# Patient Record
Sex: Female | Born: 2005 | Race: Black or African American | Hispanic: No | Marital: Single | Smoking: Never smoker
Health system: Southern US, Community
[De-identification: ages and names within clinical notes are randomized; demographics above are authoritative.]

---
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
## 2024-07-17 ENCOUNTER — Other Ambulatory Visit: Payer: Self-pay

## 2024-07-17 ENCOUNTER — Inpatient Hospital Stay (HOSPITAL_BASED_OUTPATIENT_CLINIC_OR_DEPARTMENT_OTHER)
Admission: AD | Admit: 2024-07-17 | Discharge: 2024-07-19 | DRG: 832 | Disposition: A | Discharge status: Home / Self Care | Attending: Obstetrics & Gynecology | Admitting: Obstetrics & Gynecology

## 2024-07-17 ENCOUNTER — Encounter (HOSPITAL_BASED_OUTPATIENT_CLINIC_OR_DEPARTMENT_OTHER): Payer: Self-pay

## 2024-07-17 DIAGNOSIS — Z833 Family history of diabetes mellitus: Secondary | ICD-10-CM

## 2024-07-17 DIAGNOSIS — D62 Acute posthemorrhagic anemia: Secondary | ICD-10-CM | POA: Diagnosis present

## 2024-07-17 DIAGNOSIS — Z8249 Family history of ischemic heart disease and other diseases of the circulatory system: Secondary | ICD-10-CM

## 2024-07-17 DIAGNOSIS — D649 Anemia, unspecified: Secondary | ICD-10-CM | POA: Diagnosis present

## 2024-07-17 DIAGNOSIS — O364XX0 Maternal care for intrauterine death, not applicable or unspecified: Principal | ICD-10-CM | POA: Diagnosis present

## 2024-07-17 DIAGNOSIS — Z3A20 20 weeks gestation of pregnancy: Secondary | ICD-10-CM

## 2024-07-17 LAB — COMPREHENSIVE METABOLIC PANEL WITH GFR
ALT: <5 U/L (ref 0–44)
AST: 27 U/L (ref 15–41)
Albumin: 4 g/dL (ref 3.5–5.0)
Alkaline Phosphatase: 158 U/L — ABNORMAL HIGH (ref 38–126)
Anion gap: 16 — ABNORMAL HIGH (ref 5–15)
BUN: <5 mg/dL — ABNORMAL LOW (ref 6–20)
CO2: 18 mmol/L — ABNORMAL LOW (ref 22–32)
Calcium: 9.5 mg/dL (ref 8.9–10.3)
Chloride: 104 mmol/L (ref 98–111)
Creatinine, Ser: 0.69 mg/dL (ref 0.44–1.00)
GFR, Estimated: >60 mL/min (ref >60)
Glucose, Bld: 89 mg/dL (ref 70–99)
Potassium: 3.6 mmol/L (ref 3.5–5.1)
Sodium: 137 mmol/L (ref 135–145)
Total Bilirubin: 0.4 mg/dL (ref 0.0–1.2)
Total Protein: 7.8 g/dL (ref 6.5–8.1)

## 2024-07-17 LAB — CBC
HCT: 32.3 % — ABNORMAL LOW (ref 36.0–46.0)
Hemoglobin: 10.8 g/dL — ABNORMAL LOW (ref 12.0–15.0)
MCH: 30.3 pg (ref 26.0–34.0)
MCHC: 33.4 g/dL (ref 30.0–36.0)
MCV: 90.5 fL (ref 80.0–100.0)
Platelets: 316 K/uL (ref 150–400)
RBC: 3.57 MIL/uL — ABNORMAL LOW (ref 3.87–5.11)
RDW: 12 % (ref 11.5–15.5)
WBC: 7.8 K/uL (ref 4.0–10.5)
nRBC: 0 % (ref 0.0–0.2)

## 2024-07-17 LAB — LIPASE, BLOOD: Lipase: 10 U/L — ABNORMAL LOW (ref 11–51)

## 2024-07-17 MED ORDER — HYDROMORPHONE HCL 1 MG/ML IJ SOLN
0.5000 mg | INTRAMUSCULAR | Status: DC | PRN
  Administered 2024-07-17 – 2024-07-18 (×2): 0.5 mg via INTRAVENOUS
  Filled 2024-07-17 (×3): qty 1

## 2024-07-17 MED ORDER — SODIUM CHLORIDE 0.9 % IV BOLUS
1000.0000 mL | Freq: Once | INTRAVENOUS | Status: AC
  Administered 2024-07-17: 1000 mL via INTRAVENOUS

## 2024-07-17 NOTE — ED Triage Notes (Signed)
 Abdominal pain starting at 1400. Went to vomit and felt something come out of her vagina. States she can still feel it 'hanging there'. Nothing visualized externally on exam. Patient unable to sit.

## 2024-07-17 NOTE — ED Notes (Signed)
 Patient called out of the triage room and said that something 'popped'. Brown watery liquid on the floor. Dr. Trine to triage to assess. Patient felt relief of pain. Change of clothes provided.

## 2024-07-17 NOTE — ED Provider Notes (Incomplete)
 [facility] Provider Note  CSN: [id number] Arrival date & time: 07/17/24 2312  Chief Complaint(s) No chief complaint on file.  HPI Anna Gross is a 18 y.o. female currently on her menstrual cycle is here for abdominal cramping and bulging coming to her vagina.  Patient feels like something is about to pop out.  Mother reported seeing a sac that look like gestational sac.  Patient reports having a negative pregnancy test last month.  States that she was last sexually active in April.  Her last menstrual cycle was August.  The history is provided by the patient.    Past Medical History History reviewed. No pertinent past medical history. There are no active problems to display for this patient.  Home Medication(s) Prior to Admission medications   Medication Sig Start Date End Date Taking? Authorizing Provider  cholecalciferol (VITAMIN D) 400 units TABS tablet Take 400 Units by mouth.    [provider]  doxycycline  (VIBRAMYCIN ) 100 MG capsule Take 1 capsule (100 mg total) by mouth 2 (two) times daily. 04/13/23   Christopher Savannah, PA-C  ondansetron  (ZOFRAN ) 8 MG tablet Take 1 tablet (8 mg total) by mouth every 8 (eight) hours as needed for nausea or vomiting. 11/09/20   Graham, Laura E, PA-C                                                                                                                                    Allergies Patient has no known allergies.  Review of Systems Review of Systems As noted in HPI  Physical Exam Vital Signs  I have reviewed the triage vital signs BP (!) 144/93 (BP Location: Right Arm)   Pulse (!) 114   Temp 98.2 F (36.8 C) (Oral)   Resp 16   LMP 07/17/2024 (Exact Date)   SpO2 98%  *** Physical Exam Vitals reviewed.  Constitutional:      General: She is not in acute distress.    Appearance: She is well-developed. She is not diaphoretic.  HENT:     Head: Normocephalic and atraumatic.      Right Ear: External ear normal.     Left Ear: External ear normal.     Nose: Nose normal.  Eyes:     General: No scleral icterus.    Conjunctiva/sclera: Conjunctivae normal.  Neck:     Trachea: Phonation normal.  Cardiovascular:     Rate and Rhythm: Normal rate and regular rhythm.  Pulmonary:     Effort: Pulmonary effort is normal. No respiratory distress.     Breath sounds: No stridor.  Abdominal:     General: There is no distension.  Genitourinary:    Comments: *** Musculoskeletal:        General: Normal range of motion.     Cervical back: Normal range of motion.  Neurological:     Mental Status: She is alert and oriented to  person, place, and time.  Psychiatric:        Behavior: Behavior normal.     ED Results and Treatments Labs (all labs ordered are listed, but only abnormal results are displayed) Labs Reviewed  LIPASE, BLOOD - Abnormal; Notable for the following components:      Result Value   Lipase 10 (*)    All other components within normal limits  COMPREHENSIVE METABOLIC PANEL WITH GFR - Abnormal; Notable for the following components:   CO2 18 (*)    BUN <5 (*)    Alkaline Phosphatase 158 (*)    Anion gap 16 (*)    All other components within normal limits  CBC - Abnormal; Notable for the following components:   RBC 3.57 (*)    Hemoglobin 10.8 (*)    HCT 32.3 (*)    All other components within normal limits  URINALYSIS, ROUTINE W REFLEX MICROSCOPIC  PREGNANCY, URINE  HCG, QUANTITATIVE, PREGNANCY                                                                                                                         EKG  EKG Interpretation Date/Time:    Ventricular Rate:    PR Interval:    QRS Duration:    QT Interval:    QTC Calculation:   R Axis:      Text Interpretation:         Radiology No results found.  Medications Ordered in ED Medications  sodium chloride  0.9 % bolus 1,000 mL (has no administration in time range)  HYDROmorphone   (DILAUDID ) injection 0.5 mg (has no administration in time range)   Procedures Procedures  (including critical care time) Medical Decision Making / ED Course   Medical Decision Making Amount and/or Complexity of Data Reviewed Labs: ordered. Radiology: ordered.  Risk Prescription drug management.    ***    Final Clinical Impression(s) / ED Diagnoses Final diagnoses:  None    This chart was dictated using voice recognition software.  Despite best efforts to proofread,  errors can occur which can change the documentation meaning.

## 2024-07-18 ENCOUNTER — Other Ambulatory Visit: Payer: Self-pay | Admitting: Family Medicine

## 2024-07-18 ENCOUNTER — Encounter (HOSPITAL_COMMUNITY): Payer: Self-pay | Admitting: Obstetrics and Gynecology

## 2024-07-18 ENCOUNTER — Inpatient Hospital Stay (HOSPITAL_COMMUNITY)

## 2024-07-18 ENCOUNTER — Encounter: Payer: Self-pay | Admitting: Family Medicine

## 2024-07-18 DIAGNOSIS — D649 Anemia, unspecified: Secondary | ICD-10-CM | POA: Diagnosis present

## 2024-07-18 DIAGNOSIS — O364XX0 Maternal care for intrauterine death, not applicable or unspecified: Principal | ICD-10-CM

## 2024-07-18 DIAGNOSIS — O99011 Anemia complicating pregnancy, first trimester: Secondary | ICD-10-CM

## 2024-07-18 DIAGNOSIS — O99019 Anemia complicating pregnancy, unspecified trimester: Secondary | ICD-10-CM | POA: Insufficient documentation

## 2024-07-18 DIAGNOSIS — D62 Acute posthemorrhagic anemia: Secondary | ICD-10-CM | POA: Diagnosis present

## 2024-07-18 DIAGNOSIS — Z833 Family history of diabetes mellitus: Secondary | ICD-10-CM | POA: Diagnosis not present

## 2024-07-18 DIAGNOSIS — Z3A20 20 weeks gestation of pregnancy: Secondary | ICD-10-CM | POA: Diagnosis not present

## 2024-07-18 DIAGNOSIS — Z8249 Family history of ischemic heart disease and other diseases of the circulatory system: Secondary | ICD-10-CM | POA: Diagnosis not present

## 2024-07-18 LAB — CBC
HCT: 28.2 % — ABNORMAL LOW (ref 36.0–46.0)
HCT: 24.2 % — ABNORMAL LOW (ref 36.0–46.0)
HCT: 22.5 % — ABNORMAL LOW (ref 36.0–46.0)
Hemoglobin: 9.2 g/dL — ABNORMAL LOW (ref 12.0–15.0)
Hemoglobin: 8.2 g/dL — ABNORMAL LOW (ref 12.0–15.0)
Hemoglobin: 7.3 g/dL — ABNORMAL LOW (ref 12.0–15.0)
MCH: 30.1 pg (ref 26.0–34.0)
MCH: 31.1 pg (ref 26.0–34.0)
MCH: 29.9 pg (ref 26.0–34.0)
MCHC: 32.6 g/dL (ref 30.0–36.0)
MCHC: 33.9 g/dL (ref 30.0–36.0)
MCHC: 32.4 g/dL (ref 30.0–36.0)
MCV: 92.2 fL (ref 80.0–100.0)
MCV: 91.7 fL (ref 80.0–100.0)
MCV: 92.2 fL (ref 80.0–100.0)
Platelets: 292 K/uL (ref 150–400)
Platelets: 268 K/uL (ref 150–400)
Platelets: 252 K/uL (ref 150–400)
RBC: 3.06 MIL/uL — ABNORMAL LOW (ref 3.87–5.11)
RBC: 2.64 MIL/uL — ABNORMAL LOW (ref 3.87–5.11)
RBC: 2.44 MIL/uL — ABNORMAL LOW (ref 3.87–5.11)
RDW: 12.1 % (ref 11.5–15.5)
RDW: 12.3 % (ref 11.5–15.5)
RDW: 12.4 % (ref 11.5–15.5)
WBC: 12.3 K/uL — ABNORMAL HIGH (ref 4.0–10.5)
WBC: 10.4 K/uL (ref 4.0–10.5)
WBC: 7.2 K/uL (ref 4.0–10.5)
nRBC: 0 % (ref 0.0–0.2)
nRBC: 0 % (ref 0.0–0.2)
nRBC: 0 % (ref 0.0–0.2)

## 2024-07-18 LAB — PREPARE RBC (CROSSMATCH)

## 2024-07-18 LAB — HCG, QUANTITATIVE, PREGNANCY: hCG, Beta Chain, Quant, S: 201 m[IU]/mL — ABNORMAL HIGH (ref <5)

## 2024-07-18 MED ORDER — ACETAMINOPHEN 500 MG PO TABS
1000.0000 mg | ORAL_TABLET | Freq: Once | ORAL | Status: AC
  Administered 2024-07-18: 1000 mg via ORAL
  Filled 2024-07-18: qty 2

## 2024-07-18 MED ORDER — ONDANSETRON 4 MG PO TBDP
4.0000 mg | ORAL_TABLET | Freq: Three times a day (TID) | ORAL | Status: DC | PRN
  Administered 2024-07-18: 4 mg via ORAL
  Filled 2024-07-18: qty 1

## 2024-07-18 MED ORDER — DIPHENHYDRAMINE HCL 50 MG/ML IJ SOLN
25.0000 mg | Freq: Once | INTRAMUSCULAR | Status: AC
  Administered 2024-07-18: 25 mg via INTRAVENOUS
  Filled 2024-07-18: qty 1

## 2024-07-18 MED ORDER — ONDANSETRON HCL 4 MG/2ML IJ SOLN
4.0000 mg | Freq: Once | INTRAMUSCULAR | Status: AC
  Administered 2024-07-18: 4 mg via INTRAVENOUS
  Filled 2024-07-18: qty 2

## 2024-07-18 MED ORDER — SODIUM CHLORIDE 0.9% IV SOLUTION: Freq: Once | INTRAVENOUS | Status: AC

## 2024-07-18 MED ORDER — MISOPROSTOL 200 MCG PO TABS
800.0000 ug | ORAL_TABLET | Freq: Once | ORAL | Status: AC
  Administered 2024-07-18: 800 ug via RECTAL
  Filled 2024-07-18: qty 4

## 2024-07-18 MED ORDER — LACTATED RINGERS IV SOLN: INTRAVENOUS | Status: AC

## 2024-07-18 MED ORDER — ACETAMINOPHEN 325 MG PO TABS
650.0000 mg | ORAL_TABLET | ORAL | Status: DC | PRN
  Administered 2024-07-18 – 2024-07-19 (×3): 650 mg via ORAL
  Filled 2024-07-18 (×3): qty 2

## 2024-07-18 MED ORDER — IBUPROFEN 600 MG PO TABS
600.0000 mg | ORAL_TABLET | Freq: Four times a day (QID) | ORAL | Status: DC | PRN
  Administered 2024-07-19: 600 mg via ORAL
  Filled 2024-07-18: qty 1

## 2024-07-18 NOTE — ED Notes (Signed)
 Delivery of fetus with no signs of life at approx 12:15am

## 2024-07-18 NOTE — ED Notes (Signed)
 While sitting with patient and her mother providing emotional support, pt began c/o lower abd cramping once again and advised me that it's out. Upon inspecting, the object protruding from pt's vagina was coming out a little bit more. MD arrived in the room and as pt continued to feel the urge to push, he assisted with delivery of a deceased fetus. Umbilical cord and placenta were still attached and intact. Pt was encouraged to keep pushing as MD facilitated delivery of placenta. Large amount of bright red blood noted. MD performed fundal massage. Pt denied knowing she was pregnant. States she took a pregnancy test approx 1 month ago and it was negative. Pt states last intercourse was in April. Pt was cleaned up, gown and linen changes performed. Pt was medicated for pain and nausea per MAR. [facility] arrived in ED and care was transferred without further incident.

## 2024-07-18 NOTE — Plan of Care (Signed)

## 2024-07-18 NOTE — Progress Notes (Signed)
 Patient reports feeling better, first pRBC unit is transfusing.  Blood pressure 114/67, pulse (!) 102, temperature 99.4 F (37.4 C), temperature source Oral, resp. rate 18, height 5' 1 (1.549 m), weight 48.1 kg, last menstrual period 07/17/2024, SpO2 100%.  Will continue transfusion as ordered, check CBC in morning. Routine floor care.  Gloris Hugger, MD

## 2024-07-18 NOTE — Progress Notes (Addendum)
 Called by RN due to patient having symptoms of acute blood loss anemia.   Saw patient, she is dizzy with standing and palpitations She reports improved bleeding over the course of the day   Exam:  BP (!) 133/94   Pulse (!) 122   Temp 98.4 F (36.9 C) (Oral)   Resp 18   Ht 5' 1 (1.549 m)   Wt 48.1 kg   LMP 07/17/2024 (Exact Date)   SpO2 100%   BMI 20.03 kg/m   Gen; Pale appearing HEENT: conjunctiva pale CV: Tachycardia. Slow cap refill    Reviewed HGB results from prior showing downward trend  Lab Results  Component Value Date   HGB 8.2 (L) 07/18/2024   HGB 9.2 (L) 07/18/2024   HGB 10.8 (L) 07/17/2024   A/P: Recommended and ordered stat CBC, possibly patient is equilibrating <8.0 Consented for blood is < 8.0 If stable will discharge home and plan for outpatient FE -- orders were placed already for this  Suzen Maryan Masters, MD

## 2024-07-18 NOTE — H&P (Signed)
 FACULTY PRACTICE ANTEPARTUM ADMISSION HISTORY AND PHYSICAL NOTE   History of Present Illness: Anna Gross is a 18 y.o. G1P0000  admitted for IUFD at >20 weeks. She initially presented to the [facility] ED for abdominal cramping and bulging from her vagina. Unknown last LMP. Last sexually active in April of 2025. She delivered a fetus and placenta at [facility] ED and was then transferred for further evaluation. She denies knowing she was pregnant and so has had no prenatal care. She endorses VB, fatigue.   Patient Active Problem List   Diagnosis Date Noted   IUFD at 20 weeks or more of gestation 07/18/2024    History reviewed. No pertinent past medical history.  History reviewed. No pertinent surgical history.  OB History  Gravida Para Term Preterm AB Living  1 0  0    SAB IAB Ectopic Multiple Live Births          # Outcome Date GA Lbr Len/2nd Weight Sex Type Anes PTL Lv  1 Current             Social History   Socioeconomic History   Marital status: Single    Spouse name: Not on file   Number of children: Not on file   Years of education: Not on file   Highest education level: Not on file  Occupational History   Not on file  Tobacco Use   Smoking status: Never   Smokeless tobacco: Never  Vaping Use   Vaping status: Never Used  Substance and Sexual Activity   Alcohol use: Yes    Comment: Occ   Drug use: Never   Sexual activity: Not Currently    Birth control/protection: None  Other Topics Concern   Not on file  Social History Narrative   Not on file   Social Drivers of Health   Financial Resource Strain: Not on File (02/17/2022)   Received from [employer]    Financial Resource Strain: 0  Food Insecurity: Not at Risk (09/12/2023)   Received from [employer] Insecurity    Within the past 12 months, the food you bought just didn't last and you didn't have enough money to get more.: 1  Transportation Needs: Not at Risk (09/12/2023)   Received  from [employer] Needs    In the past 12 months, has lack of transportation kept you from medical appointments, meetings, work or from getting things needed for daily living? (Check all that apply): 1  Physical Activity: Not on File (02/17/2022)   Received from [facility]   Physical Activity    Physical Activity: 0  Stress: Not on File (02/17/2022)   Received from [facility]   Stress    Stress: 0  Social Connections: Not on File (07/15/2023)   Received from [facility]   Social Connections    Connectedness: 0    Family History  Problem Relation Age of Onset   Hypertension Mother    Diabetes Mother    Healthy Father     No Known Allergies  Medications Prior to Admission  Medication Sig Dispense Refill Last Dose/Taking   cholecalciferol (VITAMIN D) 400 units TABS tablet Take 400 Units by mouth.      doxycycline  (VIBRAMYCIN ) 100 MG capsule Take 1 capsule (100 mg total) by mouth 2 (two) times daily. 14 capsule 0    ondansetron  (ZOFRAN ) 8 MG tablet Take 1 tablet (8 mg total) by mouth every 8 (eight) hours as needed for nausea or  vomiting. 20 tablet 0     Review of Systems - Negative except for what is listed in the HPI.  Vitals:  BP 124/73   Pulse (!) 120   Temp 99.1 F (37.3 C) (Oral)   Resp (!) 25   Ht 5' 1 (1.549 m)   Wt 48.1 kg   LMP 07/17/2024 (Exact Date)   SpO2 100%   BMI 20.03 kg/m  Physical Examination: CONSTITUTIONAL: Well-developed, well-nourished female in no acute distress.  HENT:  Normocephalic, atraumatic, External right and left ear normal. Oropharynx is clear and moist EYES: Conjunctivae and EOM are normal. Pupils are equal, round, and reactive to light. No scleral icterus.  NECK: Normal range of motion, supple, no masses SKIN: Skin is warm and dry. No rash noted. Not diaphoretic. No erythema. No pallor. NEUROLOGIC: Alert and oriented to person, place, and time. Normal reflexes, muscle tone coordination. No cranial nerve deficit noted. PSYCHIATRIC: Normal  mood and affect. Normal behavior. Normal judgment and thought content. CARDIOVASCULAR: Normal heart rate noted, regular rhythm RESPIRATORY: Effort and breath sounds normal, no problems with respiration noted ABDOMEN: Soft, nontender, nondistended, gravid. MUSCULOSKELETAL: Normal range of motion. No edema and no tenderness. 2+ distal pulses.   Labs:  Results for orders placed or performed during the hospital encounter of 07/17/24 (from the past 24 hours)  Lipase, blood   Collection Time: 07/17/24 11:22 PM  Result Value Ref Range   Lipase 10 (L) 11 - 51 U/L  Comprehensive metabolic panel   Collection Time: 07/17/24 11:22 PM  Result Value Ref Range   Sodium 137 135 - 145 mmol/L   Potassium 3.6 3.5 - 5.1 mmol/L   Chloride 104 98 - 111 mmol/L   CO2 18 (L) 22 - 32 mmol/L   Glucose, Bld 89 70 - 99 mg/dL   BUN <5 (L) 6 - 20 mg/dL   Creatinine, Ser 9.30 0.44 - 1.00 mg/dL   Calcium 9.5 8.9 - 89.6 mg/dL   Total Protein 7.8 6.5 - 8.1 g/dL   Albumin 4.0 3.5 - 5.0 g/dL   AST 27 15 - 41 U/L   ALT <5 0 - 44 U/L   Alkaline Phosphatase 158 (H) 38 - 126 U/L   Total Bilirubin 0.4 0.0 - 1.2 mg/dL   GFR, Estimated >39 >39 mL/min   Anion gap 16 (H) 5 - 15  CBC   Collection Time: 07/17/24 11:22 PM  Result Value Ref Range   WBC 7.8 4.0 - 10.5 K/uL   RBC 3.57 (L) 3.87 - 5.11 MIL/uL   Hemoglobin 10.8 (L) 12.0 - 15.0 g/dL   HCT 67.6 (L) 63.9 - 53.9 %   MCV 90.5 80.0 - 100.0 fL   MCH 30.3 26.0 - 34.0 pg   MCHC 33.4 30.0 - 36.0 g/dL   RDW 87.9 88.4 - 84.4 %   Platelets 316 150 - 400 K/uL   nRBC 0.0 0.0 - 0.2 %  hCG, quantitative, pregnancy   Collection Time: 07/17/24 11:22 PM  Result Value Ref Range   hCG, Beta Chain, Quant, S 201 (H) <5 mIU/mL  Type and screen [facility]   Collection Time: 07/18/24  1:26 AM  Result Value Ref Range   ABO/RH(D) B POS    Antibody Screen NEG    Sample Expiration      07/21/2024,2359 Performed at [facility], [address]  [place], [place] [address]   CBC   Collection Time: 07/18/24  1:30 AM  Result Value Ref  Range   WBC 12.3 (H) 4.0 - 10.5 K/uL   RBC 3.06 (L) 3.87 - 5.11 MIL/uL   Hemoglobin 9.2 (L) 12.0 - 15.0 g/dL   HCT 71.7 (L) 63.9 - 53.9 %   MCV 92.2 80.0 - 100.0 fL   MCH 30.1 26.0 - 34.0 pg   MCHC 32.6 30.0 - 36.0 g/dL   RDW 87.8 88.4 - 84.4 %   Platelets 292 150 - 400 K/uL   nRBC 0.0 0.0 - 0.2 %    Imaging Studies: US  PELVIS (TRANSABDOMINAL ONLY) Result Date: 07/18/2024 CLINICAL DATA:  [id number]. Miscarriage 8-20 weeks' gestation. Fetal demise prior to delivery. Now with heavy vaginal bleeding. EXAM: TRANSABDOMINAL ULTRASOUND OF PELVIS TECHNIQUE: Transabdominal ultrasound examination of the pelvis was performed including evaluation of the uterus, ovaries, adnexal regions, and pelvic cul-de-sac. COMPARISON:  None. FINDINGS: Uterus Measurements: Anteverted measuring 13.2 x 7.4 x 8.6 cm = volume: 442.1 mL. No fibroids or other mass visualized. The cervix is not well seen due to bowel gas shadowing with the visualized portions unremarkable. Endometrium Thickness: 8.4 mm. There is scattered vascular flow within the complex in the proximal uterine cavity concerning for retained products of conception. Right ovary Measurements: 2.4 x 1.9 x 2.5 cm = volume: 5.9 mL. Normal appearance/no adnexal mass. Left ovary Measurements: 2.5 x 1.1 x 2.7 cm = volume: 3.6 mL. Normal appearance/no adnexal mass. Other findings: There is preservation of color flow to both ovaries. No abnormal free fluid. IMPRESSION: 1. Scattered vascular flow within the endometrial complex in the proximal uterine cavity concerning for retained products of conception. 2. The cervix is not well seen due to bowel gas shadowing with the visualized portions unremarkable. 3. The adnexal spaces and ovaries are unremarkable. 4. No abnormal free fluid. Electronically Signed   By: Francis Quam M.D.   On: 07/18/2024 03:05     Assessment and Plan: Patient Active  Problem List   Diagnosis Date Noted   IUFD at 20 weeks or more of gestation 07/18/2024   Ferrah Panagopoulos is a 18 y.o. G1P0000  admitted for IUFD at >20 weeks.  -Transported from [facility] ED after IUFD and delivery. Fetus and placenta delivered with patient. Based on size, appears to be >20 weeks. -Fundus was firm on arrival but massage expressed large amount of clots. Continued fundal massage improved vaginal bleeding. QBL as of now 687. -US  concerning for retained products of conception. -Patient is Rh (+) -CBC 10.8 -> 9.2  - cyotec given rectally  Admit to [facility] specialty care for observation -Will continue to observe bleeding and repeat CBC in 4 hours    Barkley Angles, MD OB Fellow, Faculty Practice [facility], Center for [employer]

## 2024-07-18 NOTE — Progress Notes (Signed)
 Patient seen and evaluated this morning. Reports bleeding as mild Denies pain. Reports lightheadedness/dizziness with ambulation  BP 106/64 (BP Location: Left Arm)   Pulse (!) 111   Temp 98 F (36.7 C) (Oral)   Resp 18   Ht 5' 1 (1.549 m)   Wt 48.1 kg   LMP 07/17/2024 (Exact Date)   SpO2 100%   BMI 20.03 kg/m   Gen: appears well Abd: soft, nontender Lochia: small trickle with fundal exam  Lab Results  Component Value Date   WBC 12.3 (H) 07/18/2024   HGB 9.2 (L) 07/18/2024   HCT 28.2 (L) 07/18/2024   MCV 92.2 07/18/2024   PLT 292 07/18/2024    A/P: - repeat CBC this am - monitor bleeding - discussed possibility of procedure prn symptoms  Rollo ONEIDA Bring, MD, FACOG Obstetrician & Gynecologist, [facility] for [facility], [facility]

## 2024-07-18 NOTE — ED Notes (Signed)
 Patient called out from the restroom stating something is coming out'. Elnor object coming from pelvis. Dr. Trine called to assess. Patient transported to room via wheelchair. Placed in trendelenburg.

## 2024-07-18 NOTE — MAU Provider Note (Signed)
 Patient came in after IUFD delivered at [facility] ED. Sent to MAU for further monitoring. Is being admitted to [facility] specialty care for further observation. See H&P for more details.

## 2024-07-18 NOTE — Progress Notes (Signed)
 Patient with continued symptoms of lightheadedness and weakness; also dizzy and feels heart is racing.  Blood pressure (!) 133/94, pulse (!) 122, temperature 98.4 F (36.9 C), temperature source Oral, resp. rate 18, height 5' 1 (1.549 m), weight 48.1 kg, last menstrual period 07/17/2024, SpO2 100%. No active bleeding reported, patient noted small amount of blood on pad     Latest Ref Rng & Units 07/18/2024    4:43 PM 07/18/2024    7:16 AM 07/18/2024    1:30 AM  CBC  WBC 4.0 - 10.5 K/uL 7.2  10.4  12.3   Hemoglobin 12.0 - 15.0 g/dL 7.3  8.2  9.2   Hematocrit 36.0 - 46.0 % 22.5  24.2  28.2   Platelets 150 - 400 K/uL 252  268  292    Patient has symptomatic acute blood loss anemia after miscarriage. Blood transfusion recommended, patient agreed to plan. She was consented for blood transfusion. 2 pRBCs ordered for now, will check post transfusion hemoglobin in morning. Will keep patient overnight for observation and for the transfusion.   GLORIS HUGGER, MD, FACOG Obstetrician & Gynecologist, [facility] for [employer], [facility]

## 2024-07-18 NOTE — MAU Note (Signed)
 Anna Gross is a 18 y.o. at Unknown here in MAU reporting: IUFD transfer from [facility]   LMP: 06/14/2024 Onset of complaint: 07/17/24 @1600  Pain score: 5 Vitals:   07/18/24 0045 07/18/24 0127  BP: 121/87   Pulse: 99   Resp: (!) 25   Temp:  99.1 F (37.3 C)  SpO2: 100%      FHT: N/A  Lab orders placed from triage: N/A

## 2024-07-19 ENCOUNTER — Encounter: Payer: Self-pay | Admitting: Family Medicine

## 2024-07-19 ENCOUNTER — Other Ambulatory Visit (HOSPITAL_COMMUNITY): Payer: Self-pay

## 2024-07-19 ENCOUNTER — Encounter: Payer: Self-pay | Admitting: Obstetrics & Gynecology

## 2024-07-19 ENCOUNTER — Telehealth: Payer: Self-pay

## 2024-07-19 ENCOUNTER — Other Ambulatory Visit: Payer: Self-pay

## 2024-07-19 LAB — BPAM RBC
Blood Product Expiration Date: 202510092359
Blood Product Expiration Date: 202510102359
ISSUE DATE / TIME: 202509182015
ISSUE DATE / TIME: 202509182358
Unit Type and Rh: 7300
Unit Type and Rh: 7300

## 2024-07-19 LAB — TYPE AND SCREEN
ABO/RH(D): B POS
Antibody Screen: NEGATIVE
Unit division: 0
Unit division: 0

## 2024-07-19 LAB — CBC
HCT: 32 % — ABNORMAL LOW (ref 36.0–46.0)
Hemoglobin: 11 g/dL — ABNORMAL LOW (ref 12.0–15.0)
MCH: 30.2 pg (ref 26.0–34.0)
MCHC: 34.4 g/dL (ref 30.0–36.0)
MCV: 87.9 fL (ref 80.0–100.0)
Platelets: 228 K/uL (ref 150–400)
RBC: 3.64 MIL/uL — ABNORMAL LOW (ref 3.87–5.11)
RDW: 13.9 % (ref 11.5–15.5)
WBC: 9.3 K/uL (ref 4.0–10.5)
nRBC: 0 % (ref 0.0–0.2)

## 2024-07-19 LAB — ABO/RH: ABO/RH(D): B POS

## 2024-07-19 MED ORDER — ACETAMINOPHEN 500 MG PO TABS: 1000.0000 mg | ORAL_TABLET | Freq: Four times a day (QID) | ORAL | Status: AC | PRN

## 2024-07-19 MED ORDER — AZITHROMYCIN 250 MG PO TABS
1000.0000 mg | ORAL_TABLET | Freq: Once | ORAL | Status: AC
  Administered 2024-07-19: 1000 mg via ORAL
  Filled 2024-07-19: qty 4

## 2024-07-19 MED ORDER — IBUPROFEN 600 MG PO TABS
600.0000 mg | ORAL_TABLET | Freq: Four times a day (QID) | ORAL | 0 refills | Status: AC | PRN
  Filled 2024-07-19: qty 30, 8d supply, fill #0

## 2024-07-19 MED ORDER — FERROUS SULFATE 325 (65 FE) MG PO TABS
325.0000 mg | ORAL_TABLET | ORAL | Status: DC
  Administered 2024-07-19: 325 mg via ORAL
  Filled 2024-07-19: qty 1

## 2024-07-19 MED ORDER — FERROUS SULFATE 325 (65 FE) MG PO TABS
325.0000 mg | ORAL_TABLET | ORAL | 3 refills | Status: AC
  Filled 2024-07-19: qty 30, 60d supply, fill #0

## 2024-07-19 MED ORDER — IBUPROFEN 600 MG PO TABS: 600.0000 mg | ORAL_TABLET | Freq: Four times a day (QID) | ORAL | 0 refills | Status: DC | PRN

## 2024-07-19 NOTE — Discharge Summary (Signed)
 Obstetric Physician Discharge Summary  Patient ID: Anna Gross MRN: [id number] DOB/AGE: [birth year] 19 y.o.  Admit date: 07/17/2024 Discharge date: 07/19/2024  Admission Diagnoses: Principal Problem:   IUFD at 20 weeks or more of gestation Active Problems:   Symptomatic anemia   Acute blood loss anemia  Discharge Diagnoses: The same  Procedures: Blood transfusion  Hospital Course:  Anna Gross is a 18 y.o. G1P0000 who had a miscarriage estimated to be around [redacted] weeks gestation at [facility] ER.  She had significant bleeding afterwards that reduced expectantly.  However, she was found to exhibit signs of symptomatic anemia: was tachycardic, dizzy, lightheaded.  Hemoglobin was noted to go from 10.8>9.2>8.2>7.3.  She was counseled about having a blood transfusion and she agreed to this.  2 PRBCs were transfused, patient felt remarkably better.  She hd minimal bleeding and pain. Her post transfusion hemoglobin was noted to be 11.0.    She was deemed stable for discharge to home with outpatient follow up.  Discharge Exam: Temp:  [98.4 F (36.9 C)-99.4 F (37.4 C)] 98.4 F (36.9 C) (09/19 0326) Pulse Rate:  [75-125] 75 (09/19 0326) Resp:  [16-18] 16 (09/19 0326) BP: (95-133)/(58-94) 121/80 (09/19 0326) SpO2:  [99 %-100 %] 100 % (09/19 0326) Physical Examination: CONSTITUTIONAL: Well-developed, well-nourished female in no acute distress.  HENT:  Normocephalic, atraumatic, External right and left ear normal.  EYES: Conjunctivae and EOM are normal. Pupils are equal, round, and reactive to light. No scleral icterus.  NECK: Normal range of motion, supple, no masses SKIN: Skin is warm and dry. No rash noted. Not diaphoretic. No erythema. No pallor. NEUROLOGIC: Alert and oriented to person, place, and time. Normal reflexes, muscle tone coordination. No cranial nerve deficit noted. PSYCHIATRIC: Normal mood and affect. Normal behavior. Normal judgment and thought content. CARDIOVASCULAR: Normal  heart rate noted, regular rhythm RESPIRATORY: Effort and breath sounds normal, no problems with respiration noted MUSCULOSKELETAL: Normal range of motion. No edema and no tenderness. 2+ distal pulses. ABDOMEN: Soft, nontender, nondistended, gravid. CERVIX:  Deferred   Significant Diagnostic Studies:      Latest Ref Rng & Units 07/19/2024    7:01 AM 07/18/2024    4:43 PM 07/18/2024    7:16 AM  CBC  WBC 4.0 - 10.5 K/uL 9.3  7.2  10.4   Hemoglobin 12.0 - 15.0 g/dL 88.9  7.3  8.2   Hematocrit 36.0 - 46.0 % 32.0  22.5  24.2   Platelets 150 - 400 K/uL 228  252  268     Results for orders placed or performed during the hospital encounter of 07/17/24 (from the past week)  Lipase, blood   Collection Time: 07/17/24 11:22 PM  Result Value Ref Range   Lipase 10 (L) 11 - 51 U/L  Comprehensive metabolic panel   Collection Time: 07/17/24 11:22 PM  Result Value Ref Range   Sodium 137 135 - 145 mmol/L   Potassium 3.6 3.5 - 5.1 mmol/L   Chloride 104 98 - 111 mmol/L   CO2 18 (L) 22 - 32 mmol/L   Glucose, Bld 89 70 - 99 mg/dL   BUN <5 (L) 6 - 20 mg/dL   Creatinine, Ser 9.30 0.44 - 1.00 mg/dL   Calcium 9.5 8.9 - 89.6 mg/dL   Total Protein 7.8 6.5 - 8.1 g/dL   Albumin 4.0 3.5 - 5.0 g/dL   AST 27 15 - 41 U/L   ALT <5 0 - 44 U/L   Alkaline Phosphatase 158 (H) 38 - 126  U/L   Total Bilirubin 0.4 0.0 - 1.2 mg/dL   GFR, Estimated >39 >39 mL/min   Anion gap 16 (H) 5 - 15  CBC   Collection Time: 07/17/24 11:22 PM  Result Value Ref Range   WBC 7.8 4.0 - 10.5 K/uL   RBC 3.57 (L) 3.87 - 5.11 MIL/uL   Hemoglobin 10.8 (L) 12.0 - 15.0 g/dL   HCT 67.6 (L) 63.9 - 53.9 %   MCV 90.5 80.0 - 100.0 fL   MCH 30.3 26.0 - 34.0 pg   MCHC 33.4 30.0 - 36.0 g/dL   RDW 87.9 88.4 - 84.4 %   Platelets 316 150 - 400 K/uL   nRBC 0.0 0.0 - 0.2 %  hCG, quantitative, pregnancy   Collection Time: 07/17/24 11:22 PM  Result Value Ref Range   hCG, Beta Chain, Quant, S 201 (H) <5 mIU/mL  Type and screen [facility]   Collection Time: 07/18/24  1:26 AM  Result Value Ref Range   ABO/RH(D) B POS    Antibody Screen NEG    Sample Expiration 07/21/2024,2359    Unit Number [id number]    Blood Component Type RED CELLS,LR    Unit division 00    Status of Unit ISSUED    Transfusion Status OK TO TRANSFUSE    Crossmatch Result Compatible    Unit Number [id number]    Blood Component Type RED CELLS,LR    Unit division 00    Status of Unit ISSUED    Transfusion Status OK TO TRANSFUSE    Crossmatch Result      Compatible Performed at [facility], [address], [place], [place] [address]   BPAM [facility]   Collection Time: 07/18/24  1:26 AM  Result Value Ref Range   ISSUE DATE / TIME 797490817984    Blood Product Unit Number [id number]    PRODUCT CODE E0382V00    Unit Type and Rh 7300    Blood Product Expiration Date 797489897640    ISSUE DATE / TIME 797490817641    Blood Product Unit Number [id number]    PRODUCT CODE E0382V00    Unit Type and Rh 7300    Blood Product Expiration Date 797489907640   CBC   Collection Time: 07/18/24  1:30 AM  Result Value Ref Range   WBC 12.3 (H) 4.0 - 10.5 K/uL   RBC 3.06 (L) 3.87 - 5.11 MIL/uL   Hemoglobin 9.2 (L) 12.0 - 15.0 g/dL   HCT 71.7 (L) 63.9 - 53.9 %   MCV 92.2 80.0 - 100.0 fL   MCH 30.1 26.0 - 34.0 pg   MCHC 32.6 30.0 - 36.0 g/dL   RDW 87.8 88.4 - 84.4 %   Platelets 292 150 - 400 K/uL   nRBC 0.0 0.0 - 0.2 %  CBC   Collection Time: 07/18/24  7:16 AM  Result Value Ref Range   WBC 10.4 4.0 - 10.5 K/uL   RBC 2.64 (L) 3.87 - 5.11 MIL/uL   Hemoglobin 8.2 (L) 12.0 - 15.0 g/dL   HCT 75.7 (L) 63.9 - 53.9 %   MCV 91.7 80.0 - 100.0 fL   MCH 31.1 26.0 - 34.0 pg   MCHC 33.9 30.0 - 36.0 g/dL   RDW 87.6 88.4 - 84.4 %   Platelets 268 150 - 400 K/uL   nRBC 0.0 0.0 - 0.2 %  CBC   Collection Time: 07/18/24  4:43 PM  Result Value Ref Range   WBC 7.2 4.0 -  10.5 K/uL   RBC 2.44 (L) 3.87 - 5.11 MIL/uL   Hemoglobin 7.3 (L) 12.0  - 15.0 g/dL   HCT 77.4 (L) 63.9 - 53.9 %   MCV 92.2 80.0 - 100.0 fL   MCH 29.9 26.0 - 34.0 pg   MCHC 32.4 30.0 - 36.0 g/dL   RDW 87.5 88.4 - 84.4 %   Platelets 252 150 - 400 K/uL   nRBC 0.0 0.0 - 0.2 %  ABO/Rh   Collection Time: 07/18/24  4:43 PM  Result Value Ref Range   ABO/RH(D)      B POS Performed at [facility], [address], [place], [place] [address]   Prepare RBC (crossmatch)   Collection Time: 07/18/24  5:37 PM  Result Value Ref Range   Order Confirmation      ORDER PROCESSED BY BLOOD BANK Performed at [facility], [address] [place], [place] [address]   CBC   Collection Time: 07/19/24  7:01 AM  Result Value Ref Range   WBC 9.3 4.0 - 10.5 K/uL   RBC 3.64 (L) 3.87 - 5.11 MIL/uL   Hemoglobin 11.0 (L) 12.0 - 15.0 g/dL   HCT 67.9 (L) 63.9 - 53.9 %   MCV 87.9 80.0 - 100.0 fL   MCH 30.2 26.0 - 34.0 pg   MCHC 34.4 30.0 - 36.0 g/dL   RDW 86.0 88.4 - 84.4 %   Platelets 228 150 - 400 K/uL   nRBC 0.0 0.0 - 0.2 %   US  PELVIS (TRANSABDOMINAL ONLY) Result Date: 07/18/2024 CLINICAL DATA:  [id number]. Miscarriage 8-20 weeks' gestation. Fetal demise prior to delivery. Now with heavy vaginal bleeding. EXAM: TRANSABDOMINAL ULTRASOUND OF PELVIS TECHNIQUE: Transabdominal ultrasound examination of the pelvis was performed including evaluation of the uterus, ovaries, adnexal regions, and pelvic cul-de-sac. COMPARISON:  None. FINDINGS: Uterus Measurements: Anteverted measuring 13.2 x 7.4 x 8.6 cm = volume: 442.1 mL. No fibroids or other mass visualized. The cervix is not well seen due to bowel gas shadowing with the visualized portions unremarkable. Endometrium Thickness: 8.4 mm. There is scattered vascular flow within the complex in the proximal uterine cavity concerning for retained products of conception. Right ovary Measurements: 2.4 x 1.9 x 2.5 cm = volume: 5.9 mL. Normal appearance/no adnexal mass. Left ovary Measurements: 2.5 x 1.1 x 2.7 cm = volume: 3.6 mL. Normal  appearance/no adnexal mass. Other findings: There is preservation of color flow to both ovaries. No abnormal free fluid. IMPRESSION: 1. Scattered vascular flow within the endometrial complex in the proximal uterine cavity concerning for retained products of conception. 2. The cervix is not well seen due to bowel gas shadowing with the visualized portions unremarkable. 3. The adnexal spaces and ovaries are unremarkable. 4. No abnormal free fluid. Electronically Signed   By: Francis Quam M.D.   On: 07/18/2024 03:05    Discharge Condition: Stable  Discharge disposition: 01-Home or Self Care       Allergies as of 07/19/2024   No Known Allergies      Medication List     STOP taking these medications    doxycycline  100 MG capsule Commonly known as: VIBRAMYCIN    ondansetron  8 MG tablet Commonly known as: Zofran        TAKE these medications    acetaminophen  500 MG tablet Commonly known as: TYLENOL  Take 2 tablets (1,000 mg total) by mouth every 6 (six) hours as needed for mild pain (pain score 1-3) (for pain scale < 4  OR  temperature  >/=  100.5 F).   cholecalciferol 10 MCG (400 UNIT) Tabs tablet Commonly known as: VITAMIN D3 Take 400 Units by mouth.   ferrous sulfate  325 (65 FE) MG tablet Take 1 tablet (325 mg total) by mouth every other day.   ibuprofen  600 MG tablet Commonly known as: ADVIL  Take 1 tablet (600 mg total) by mouth every 6 (six) hours as needed for mild pain (pain score 1-3).        Follow-up Information     [facility] Follow up in 2 week(s).   Specialty: Obstetrics and Gynecology Why: You will be called with appointment date and time for your follow up appointment Contact information: [address] [place]  [address] [phone]                Total discharge time: 30 minutes   Signed: Gloris Hugger M.D. 07/19/2024, 8:19 AM

## 2024-07-19 NOTE — Telephone Encounter (Signed)
 Dr. Eldonna, patient will be scheduled as soon as possible.  Auth Submission: NO AUTH NEEDED Site of care: Site of care: [facility] Payer: [employer] Medication & CPT/J Code(s) submitted: Venofer (Iron Sucrose) J1756 Diagnosis Code:  Route of submission (phone, fax, portal):  Phone # Fax # Auth type: Buy/Bill PB Units/visits requested: 200mg  x 5 doses Reference number:  Approval from: 07/19/24 to 10/30/24

## 2024-07-22 LAB — SURGICAL PATHOLOGY

## 2024-07-24 ENCOUNTER — Ambulatory Visit (INDEPENDENT_AMBULATORY_CARE_PROVIDER_SITE_OTHER)

## 2024-07-24 VITALS — BP 119/85 | HR 80 | Temp 98.6°F | Resp 16 | Ht 61.0 in | Wt 116.6 lb

## 2024-07-24 DIAGNOSIS — Z3A00 Weeks of gestation of pregnancy not specified: Secondary | ICD-10-CM | POA: Diagnosis not present

## 2024-07-24 DIAGNOSIS — D649 Anemia, unspecified: Secondary | ICD-10-CM | POA: Diagnosis not present

## 2024-07-24 DIAGNOSIS — O364XX0 Maternal care for intrauterine death, not applicable or unspecified: Secondary | ICD-10-CM

## 2024-07-24 DIAGNOSIS — O99019 Anemia complicating pregnancy, unspecified trimester: Secondary | ICD-10-CM

## 2024-07-24 DIAGNOSIS — O99011 Anemia complicating pregnancy, first trimester: Secondary | ICD-10-CM

## 2024-07-24 MED ORDER — IRON SUCROSE 20 MG/ML IV SOLN
200.0000 mg | Freq: Once | INTRAVENOUS | Status: AC
  Administered 2024-07-24: 200 mg via INTRAVENOUS
  Filled 2024-07-24: qty 10

## 2024-07-24 MED ORDER — SODIUM CHLORIDE 0.9 % IV BOLUS (SEPSIS)
250.0000 mL | Freq: Once | INTRAVENOUS | Status: AC
  Administered 2024-07-24: 250 mL via INTRAVENOUS
  Filled 2024-07-24: qty 250

## 2024-07-24 NOTE — Progress Notes (Signed)
 Diagnosis: Acute Anemia  Provider:  Praveen Mannam MD  Procedure: IV Push  IV Type: Peripheral, IV Location: L Antecubital  Venofer  (Iron  Sucrose), Dose: 200 mg  Post Infusion IV Care: Observation period completed  Discharge: Condition: Good, Destination: Home . AVS Provided  Performed by:  Eleanor DELENA Bloch, RN

## 2024-07-26 ENCOUNTER — Ambulatory Visit (INDEPENDENT_AMBULATORY_CARE_PROVIDER_SITE_OTHER)

## 2024-07-26 VITALS — BP 122/82 | HR 71 | Temp 98.0°F | Resp 16 | Ht 61.0 in | Wt 118.4 lb

## 2024-07-26 DIAGNOSIS — Z3A00 Weeks of gestation of pregnancy not specified: Secondary | ICD-10-CM

## 2024-07-26 DIAGNOSIS — O364XX0 Maternal care for intrauterine death, not applicable or unspecified: Secondary | ICD-10-CM

## 2024-07-26 DIAGNOSIS — O99019 Anemia complicating pregnancy, unspecified trimester: Secondary | ICD-10-CM

## 2024-07-26 DIAGNOSIS — D649 Anemia, unspecified: Secondary | ICD-10-CM

## 2024-07-26 DIAGNOSIS — O99011 Anemia complicating pregnancy, first trimester: Secondary | ICD-10-CM

## 2024-07-26 MED ORDER — IRON SUCROSE 20 MG/ML IV SOLN
200.0000 mg | Freq: Once | INTRAVENOUS | Status: AC
  Administered 2024-07-26: 200 mg via INTRAVENOUS
  Filled 2024-07-26: qty 10

## 2024-07-26 NOTE — Progress Notes (Signed)
 Diagnosis: Iron Deficiency Anemia  Provider:  Chilton Greathouse MD  Procedure: IV Push  IV Type: Peripheral, IV Location: L Antecubital  Venofer (Iron Sucrose), Dose: 200 mg  Post Infusion IV Care: Patient declined observation and Peripheral IV Discontinued  Discharge: Condition: Good, Destination: Home . AVS Declined  Performed by:  Rico Ala, LPN

## 2024-07-30 ENCOUNTER — Ambulatory Visit

## 2024-07-30 VITALS — BP 123/72 | HR 89 | Temp 98.5°F | Resp 18 | Ht 61.0 in | Wt 118.5 lb

## 2024-07-30 DIAGNOSIS — O364XX0 Maternal care for intrauterine death, not applicable or unspecified: Secondary | ICD-10-CM

## 2024-07-30 DIAGNOSIS — O99011 Anemia complicating pregnancy, first trimester: Secondary | ICD-10-CM

## 2024-07-30 DIAGNOSIS — Z3A00 Weeks of gestation of pregnancy not specified: Secondary | ICD-10-CM | POA: Diagnosis not present

## 2024-07-30 DIAGNOSIS — D649 Anemia, unspecified: Secondary | ICD-10-CM | POA: Diagnosis not present

## 2024-07-30 MED ORDER — IRON SUCROSE 20 MG/ML IV SOLN
200.0000 mg | Freq: Once | INTRAVENOUS | Status: AC
  Administered 2024-07-30: 200 mg via INTRAVENOUS
  Filled 2024-07-30: qty 10

## 2024-07-30 MED ORDER — SODIUM CHLORIDE 0.9 % IV BOLUS: 250.0000 mL | Freq: Once | INTRAVENOUS | Status: DC

## 2024-07-30 MED ORDER — SODIUM CHLORIDE 0.9 % IV BOLUS (SEPSIS)
250.0000 mL | Freq: Once | INTRAVENOUS | Status: AC
  Administered 2024-07-30: 250 mL via INTRAVENOUS
  Filled 2024-07-30: qty 250

## 2024-07-30 NOTE — Progress Notes (Signed)
 Diagnosis: Iron Deficiency Anemia  Provider:  Chilton Greathouse MD  Procedure: IV Push  IV Type: Peripheral, IV Location: L Antecubital  Venofer (Iron Sucrose), Dose: 200 mg  Post Infusion IV Care: Observation period completed and Peripheral IV Discontinued  Discharge: Condition: Good, Destination: Home . AVS Declined  Performed by:  Adriana Mccallum, RN

## 2024-08-01 ENCOUNTER — Ambulatory Visit (INDEPENDENT_AMBULATORY_CARE_PROVIDER_SITE_OTHER)

## 2024-08-01 VITALS — BP 102/70 | HR 90 | Temp 97.9°F | Resp 18 | Ht 61.0 in | Wt 118.4 lb

## 2024-08-01 DIAGNOSIS — O364XX0 Maternal care for intrauterine death, not applicable or unspecified: Secondary | ICD-10-CM

## 2024-08-01 DIAGNOSIS — D649 Anemia, unspecified: Secondary | ICD-10-CM

## 2024-08-01 DIAGNOSIS — Z3A00 Weeks of gestation of pregnancy not specified: Secondary | ICD-10-CM | POA: Diagnosis not present

## 2024-08-01 DIAGNOSIS — O99011 Anemia complicating pregnancy, first trimester: Secondary | ICD-10-CM

## 2024-08-01 MED ORDER — IRON SUCROSE 20 MG/ML IV SOLN
200.0000 mg | Freq: Once | INTRAVENOUS | Status: AC
  Administered 2024-08-01: 200 mg via INTRAVENOUS
  Filled 2024-08-01: qty 10

## 2024-08-01 MED ORDER — SODIUM CHLORIDE 0.9 % IV BOLUS (SEPSIS)
250.0000 mL | Freq: Once | INTRAVENOUS | Status: AC
  Administered 2024-08-01: 250 mL via INTRAVENOUS
  Filled 2024-08-01: qty 250

## 2024-08-01 NOTE — Progress Notes (Signed)
 Diagnosis:   IUFD at 20 weeks or more of gestation    Provider:  Mannam, Praveen MD  Procedure: IV Push  IV Type: Peripheral, IV Location: L Antecubital  Venofer  (Iron  Sucrose), Dose: 200 mg  Venofer  IVP administered with NS running for patient comfort, per patient request.  Post Infusion IV Care: Observation period completed  Discharge: Condition: Good, Destination: Home . AVS Declined  Performed by:  Rocky FORBES Sar, RN

## 2024-08-05 ENCOUNTER — Ambulatory Visit (INDEPENDENT_AMBULATORY_CARE_PROVIDER_SITE_OTHER)

## 2024-08-05 VITALS — BP 115/72 | HR 90 | Temp 98.4°F | Resp 16 | Ht 61.0 in | Wt 116.6 lb

## 2024-08-05 DIAGNOSIS — O99011 Anemia complicating pregnancy, first trimester: Secondary | ICD-10-CM

## 2024-08-05 DIAGNOSIS — Z3A00 Weeks of gestation of pregnancy not specified: Secondary | ICD-10-CM

## 2024-08-05 DIAGNOSIS — O364XX0 Maternal care for intrauterine death, not applicable or unspecified: Secondary | ICD-10-CM

## 2024-08-05 DIAGNOSIS — D649 Anemia, unspecified: Secondary | ICD-10-CM

## 2024-08-05 DIAGNOSIS — D509 Iron deficiency anemia, unspecified: Secondary | ICD-10-CM | POA: Diagnosis not present

## 2024-08-05 MED ORDER — SODIUM CHLORIDE 0.9 % IV BOLUS (SEPSIS)
250.0000 mL | Freq: Once | INTRAVENOUS | Status: AC
  Administered 2024-08-05: 250 mL via INTRAVENOUS
  Filled 2024-08-05: qty 250

## 2024-08-05 MED ORDER — IRON SUCROSE 20 MG/ML IV SOLN
200.0000 mg | Freq: Once | INTRAVENOUS | Status: AC
  Administered 2024-08-05: 200 mg via INTRAVENOUS
  Filled 2024-08-05: qty 10

## 2024-08-05 NOTE — Progress Notes (Signed)
 Diagnosis: Iron  Deficiency Anemia  Provider:  Praveen Mannam MD  Procedure: IV Infusion  IV Type: Peripheral, IV Location: L Antecubital  Venofer  (Iron  Sucrose), Dose: 200 mg IVP  Post Infusion IV Care: Observation period completed and Peripheral IV Discontinued  Discharge: Condition: Good, Destination: Home . AVS provided.  Performed by:  Pollie Poma, RN

## 2024-08-15 ENCOUNTER — Ambulatory Visit: Admitting: Obstetrics & Gynecology

## 2024-10-14 NOTE — Addendum Note (Signed)
 Addended by: DAYNE SHERRY RAMAN on: 10/14/2024 11:38 AM   Modules accepted: Orders
